# Patient Record
Sex: Female | Born: 1965 | Race: Black or African American | Hispanic: No | Marital: Single | Smoking: Never smoker
Health system: Southern US, Community
[De-identification: ages and names within clinical notes are randomized; demographics above are authoritative.]

---

## 1998-06-20 ENCOUNTER — Other Ambulatory Visit: Admission: RE | Admit: 1998-06-20 | Discharge: 1998-06-20 | Payer: Self-pay | Admitting: Obstetrics and Gynecology

## 2013-11-01 ENCOUNTER — Ambulatory Visit (INDEPENDENT_AMBULATORY_CARE_PROVIDER_SITE_OTHER): Payer: BC Managed Care – PPO | Admitting: Family Medicine

## 2013-11-01 ENCOUNTER — Ambulatory Visit (INDEPENDENT_AMBULATORY_CARE_PROVIDER_SITE_OTHER): Payer: BC Managed Care – PPO

## 2013-11-01 VITALS — BP 130/90 | HR 70 | Temp 97.7°F | Resp 16 | Ht 65.0 in | Wt 201.0 lb

## 2013-11-01 DIAGNOSIS — T148XXA Other injury of unspecified body region, initial encounter: Secondary | ICD-10-CM

## 2013-11-01 DIAGNOSIS — S43499A Other sprain of unspecified shoulder joint, initial encounter: Secondary | ICD-10-CM

## 2013-11-01 DIAGNOSIS — M546 Pain in thoracic spine: Secondary | ICD-10-CM

## 2013-11-01 DIAGNOSIS — S46819A Strain of other muscles, fascia and tendons at shoulder and upper arm level, unspecified arm, initial encounter: Secondary | ICD-10-CM

## 2013-11-01 DIAGNOSIS — S46811A Strain of other muscles, fascia and tendons at shoulder and upper arm level, right arm, initial encounter: Secondary | ICD-10-CM

## 2013-11-01 MED ORDER — MELOXICAM 7.5 MG PO TABS: 7.5000 mg | ORAL_TABLET | Freq: Two times a day (BID) | ORAL | Status: AC | PRN

## 2013-11-01 MED ORDER — CYCLOBENZAPRINE HCL 5 MG PO TABS: 5.0000 mg | ORAL_TABLET | Freq: Every evening | ORAL | Status: AC | PRN

## 2013-11-01 NOTE — Progress Notes (Signed)
      Chief Complaint:  Chief Complaint  Patient presents with  . Neck Pain    x 3 weeks    HPI: Natasha Calderon is a 48 y.o. female who is here for  3-4 week right neck and upper back pain, mostly upper back. She has no weakness, numbness tngling Works at [employer], some repeitive motion.NKI, she has had no prior injuries. 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  Has tried ibuprofen and tylenol without releif.   History reviewed. No pertinent past medical history. History reviewed. No pertinent past surgical history. History   Social History  . Marital Status: Single    Spouse Name: N/A    Number of Children: N/A  . Years of Education: N/A   Social History Main Topics  . Smoking status: Never Smoker   . Smokeless tobacco: None  . Alcohol Use: None  . Drug Use: None  . Sexual Activity: None   Other Topics Concern  . None   Social History Narrative  . None   Family History  Problem Relation Age of Onset  . Stroke Mother    No Known Allergies Prior to Admission medications   Not on File     ROS: The patient denies fevers, chills, night sweats, unintentional weight loss, chest pain, palpitations, wheezing, dyspnea on exertion, nausea, vomiting, abdominal pain, dysuria, hematuria, melena, numbness, weakness, or tingling.   All other systems have been reviewed and were otherwise negative with the exception of those mentioned in the HPI and as above.    PHYSICAL EXAM: Filed Vitals:   11/01/13 1134  BP: 130/90  Pulse: 70  Temp: 97.7 F (36.5 C)  Resp: 16   Filed Vitals:   11/01/13 1134  Height:  (1.651 m)  Weight: 201 lb (91.173 kg)   Body mass index is 33.45 kg/(m^2).  General: Alert, no acute distress HEENT:  Normocephalic, atraumatic, oropharynx patent. EOMI, PERRLA Cardiovascular:  Regular rate and rhythm, no rubs murmurs or gallops.  No Carotid bruits, radial pulse intact. No pedal  edema.  Respiratory: Clear to auscultation bilaterally.  No wheezes, rales, or rhonchi.  No cyanosis, no use of accessory musculature GI: No organomegaly, abdomen is soft and non-tender, positive bowel sounds.  No masses. Skin: No rashes. Neurologic: Facial musculature symmetric. Psychiatric: Patient is appropriate throughout our interaction. Lymphatic: No cervical lymphadenopathy Musculoskeletal: Gait intact. Full ROm , neg deformity, neg Spurling Right trapezius tenderness + paramsk tenderness  Full ROM 5/5 strength, 2/2 DTRs Shoulder exam normal    LABS: No results found for this or any previous visit.   EKG/XRAY:   Primary read interpreted by Dr. Conley Rolls at [facility]. Neg for fx or dislocation Minimal djd   ASSESSMENT/PLAN: Encounter Diagnoses  Name Primary?  . Right-sided thoracic back pain Yes  . Sprain and strain   . Trapezius muscle strain, right, initial encounter    Rx flexeril and mobic prn ROM exercises Fu prn  Gross sideeffects, risk and benefits, and alternatives of medications d/w patient. Patient is aware that all medications have potential sideeffects and we are unable to predict every sideeffect or drug-drug interaction that may occur.  Hamilton Capri PHUONG, DO 11/01/2013 12:31 PM

## 2013-11-04 ENCOUNTER — Telehealth: Payer: Self-pay

## 2013-11-04 NOTE — Telephone Encounter (Signed)
Pt states she is still in pain from recent visit with Dr Conley Rolls. "hurting worse than i was" and is afraid to take nsaids with current pain rx.  Is unable to afford a copay until next week when she gets paid again and asks if there is anything else we can try for her. States she is taking rx as directed.   Please call pt Pharmacy: [facility] [address]

## 2013-11-06 ENCOUNTER — Other Ambulatory Visit: Payer: Self-pay | Admitting: Family Medicine

## 2013-11-06 DIAGNOSIS — T148XXA Other injury of unspecified body region, initial encounter: Secondary | ICD-10-CM

## 2013-11-06 MED ORDER — HYDROCODONE-ACETAMINOPHEN 5-325 MG PO TABS
1.0000 | ORAL_TABLET | Freq: Four times a day (QID) | ORAL | Status: AC | PRN
Start: 2013-11-06 — End: ?

## 2013-11-07 NOTE — Telephone Encounter (Signed)
Asked Magda Paganiniudrey to call patient to pick up Norco rx up front, avoid tylenol with meds.

## 2014-10-18 ENCOUNTER — Other Ambulatory Visit: Payer: Self-pay | Admitting: *Deleted

## 2014-10-18 DIAGNOSIS — E041 Nontoxic single thyroid nodule: Secondary | ICD-10-CM

## 2014-10-23 ENCOUNTER — Ambulatory Visit
Admission: RE | Admit: 2014-10-23 | Discharge: 2014-10-23 | Disposition: A | Discharge status: Home / Self Care | Payer: BLUE CROSS/BLUE SHIELD | Source: Ambulatory Visit | Attending: *Deleted | Admitting: *Deleted

## 2014-10-23 DIAGNOSIS — E041 Nontoxic single thyroid nodule: Secondary | ICD-10-CM

## 2015-12-12 ENCOUNTER — Other Ambulatory Visit: Payer: Self-pay | Admitting: Family Medicine

## 2015-12-12 DIAGNOSIS — E049 Nontoxic goiter, unspecified: Secondary | ICD-10-CM

## 2015-12-13 ENCOUNTER — Ambulatory Visit
Admission: RE | Admit: 2015-12-13 | Discharge: 2015-12-13 | Disposition: A | Discharge status: Home / Self Care | Payer: BLUE CROSS/BLUE SHIELD | Source: Ambulatory Visit | Attending: Family Medicine | Admitting: Family Medicine

## 2015-12-13 DIAGNOSIS — E049 Nontoxic goiter, unspecified: Secondary | ICD-10-CM

## 2015-12-24 ENCOUNTER — Other Ambulatory Visit: Payer: Self-pay | Admitting: Family Medicine

## 2015-12-24 DIAGNOSIS — E041 Nontoxic single thyroid nodule: Secondary | ICD-10-CM

## 2016-01-21 ENCOUNTER — Inpatient Hospital Stay: Admission: RE | Admit: 2016-01-21 | Payer: BLUE CROSS/BLUE SHIELD | Source: Ambulatory Visit

## 2016-01-23 ENCOUNTER — Ambulatory Visit
Admission: RE | Admit: 2016-01-23 | Discharge: 2016-01-23 | Disposition: A | Discharge status: Home / Self Care | Payer: BLUE CROSS/BLUE SHIELD | Source: Ambulatory Visit | Attending: Family Medicine | Admitting: Family Medicine

## 2016-01-23 ENCOUNTER — Other Ambulatory Visit (HOSPITAL_COMMUNITY)
Admission: RE | Admit: 2016-01-23 | Discharge: 2016-01-23 | Disposition: A | Discharge status: Home / Self Care | Payer: BLUE CROSS/BLUE SHIELD | Source: Ambulatory Visit | Attending: General Surgery | Admitting: General Surgery

## 2016-01-23 DIAGNOSIS — E041 Nontoxic single thyroid nodule: Secondary | ICD-10-CM

## 2017-02-17 ENCOUNTER — Other Ambulatory Visit: Payer: Self-pay | Admitting: Family Medicine

## 2017-02-17 ENCOUNTER — Ambulatory Visit
Admission: RE | Admit: 2017-02-17 | Discharge: 2017-02-17 | Disposition: A | Discharge status: Home / Self Care | Payer: BLUE CROSS/BLUE SHIELD | Source: Ambulatory Visit | Attending: Family Medicine | Admitting: Family Medicine

## 2017-02-17 DIAGNOSIS — M25571 Pain in right ankle and joints of right foot: Secondary | ICD-10-CM

## 2017-03-29 IMAGING — US US THYROID BIOPSY
1 series · 13 of 14 positions shown · non-contrast
Comparison: Ultrasound on December 13, 2015

MEDICATIONS:
1% lidocaine

COMPLICATIONS:
None immediate.

INDICATION: Indeterminate thyroid nodule within the right thyroid lobe

EXAM:
ULTRASOUND GUIDED FINE NEEDLE ASPIRATION OF INDETERMINATE THYROID
NODULE
TECHNIQUE: Informed written consent was obtained from the patient after a
discussion of the risks, benefits and alternatives to treatment.
Questions regarding the procedure were encouraged and answered. A
timeout was performed prior to the initiation of the procedure.

[Series 1: us thyroid biopsy · 0.07mm/px · 14 acquisitions, 13 frames shown]
[im 1/14]
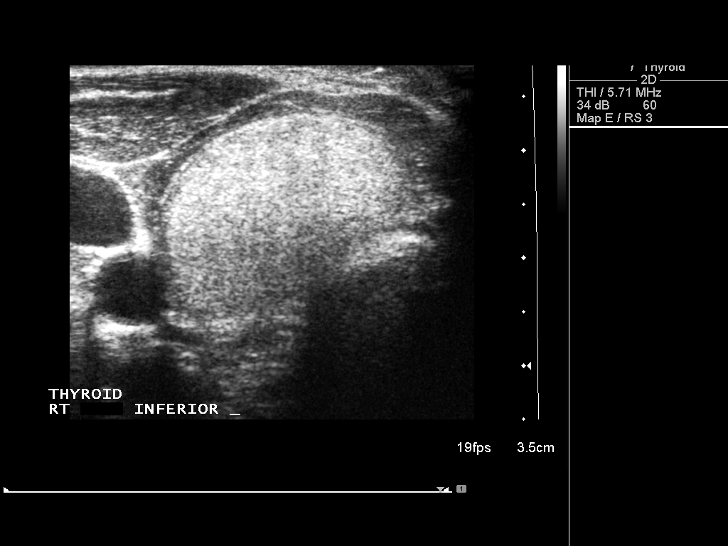
[im 2/14]
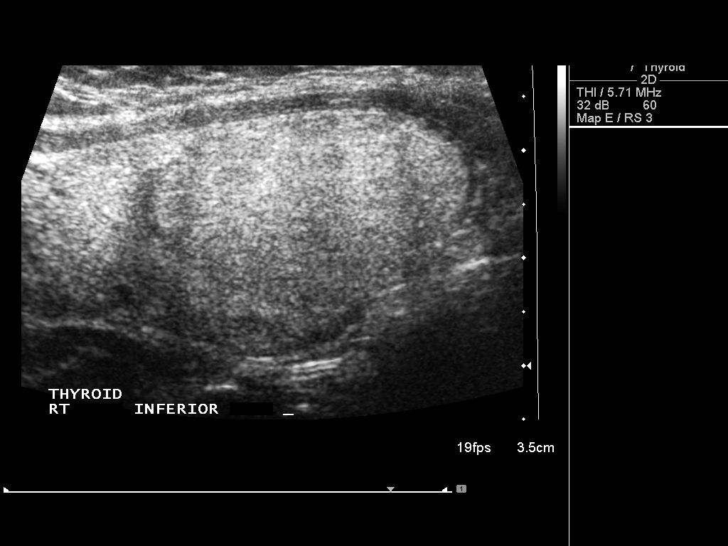
[im 3/14]
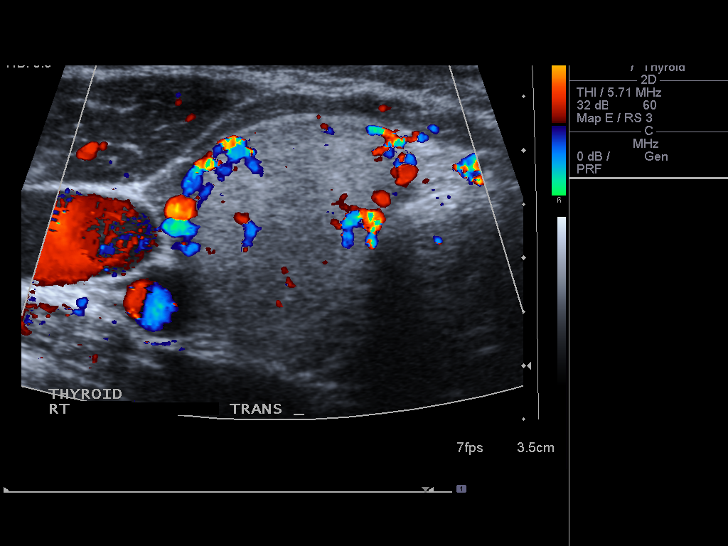
[im 4/14]
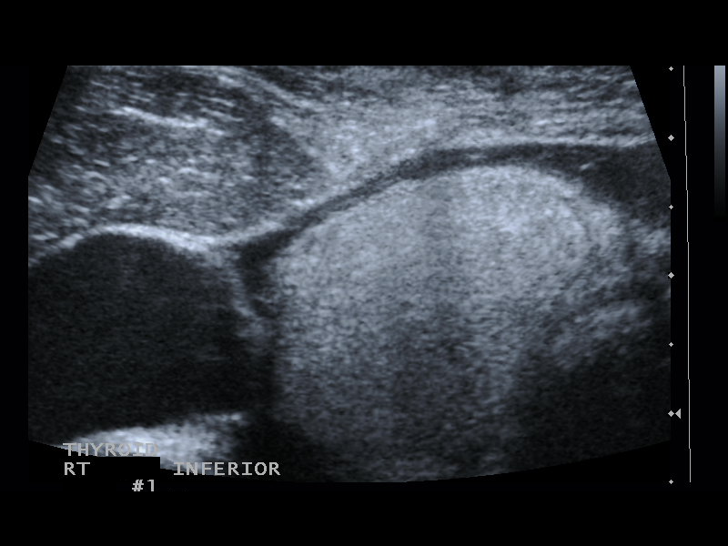
[im 5/14]
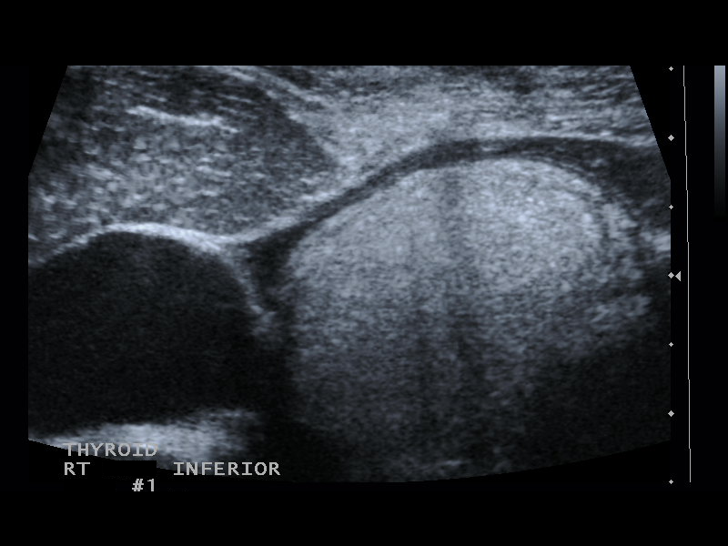
[im 6/14]
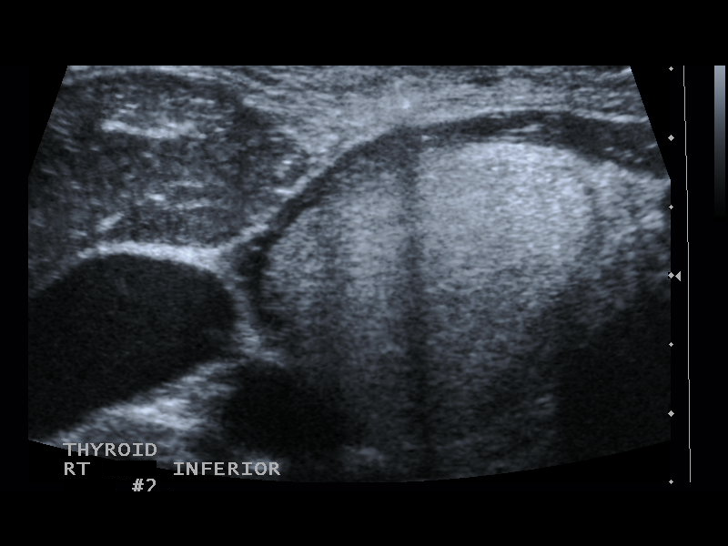
[im 8/14]
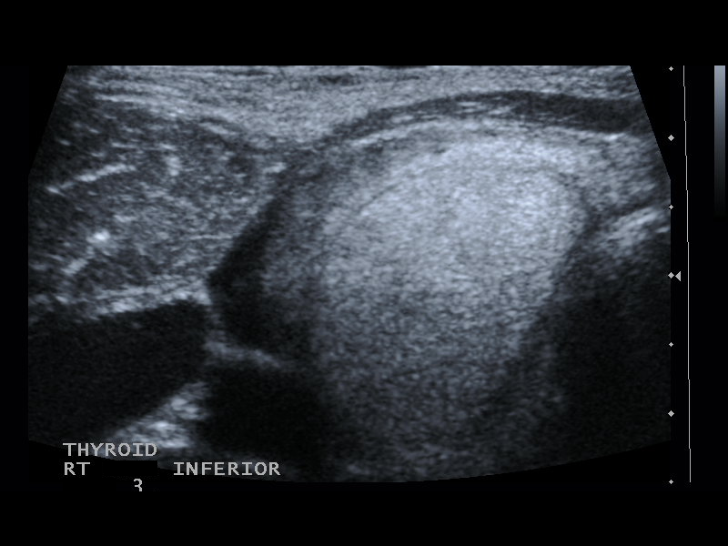
[im 9/14]
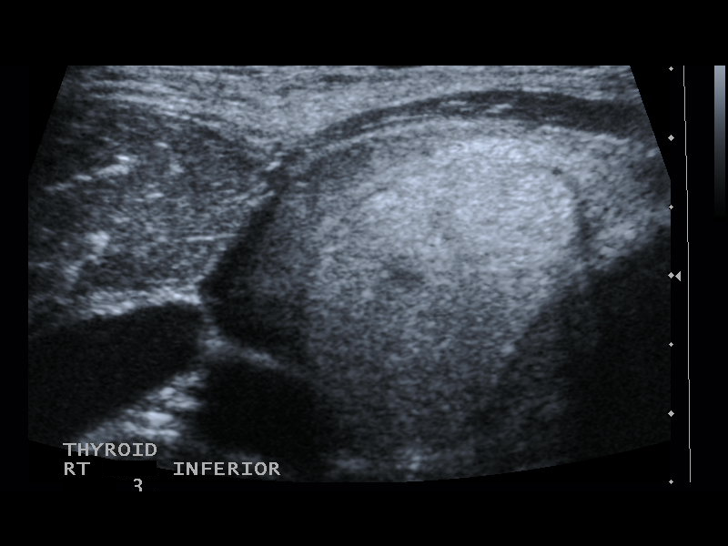
[im 10/14]
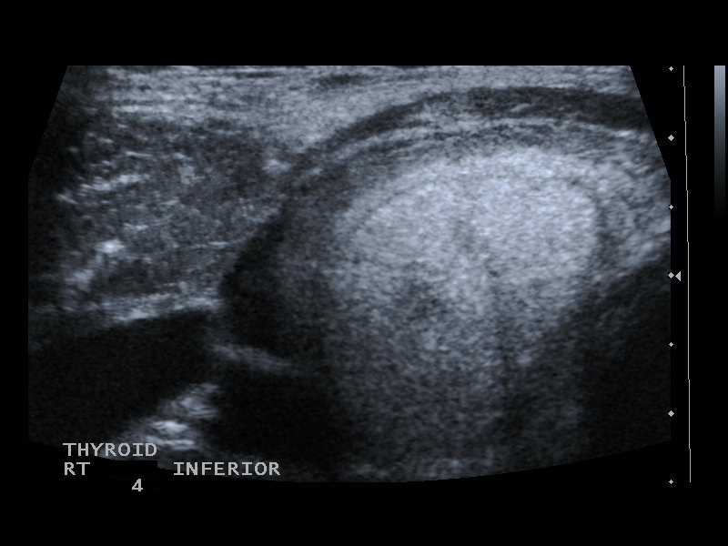
[im 11/14]
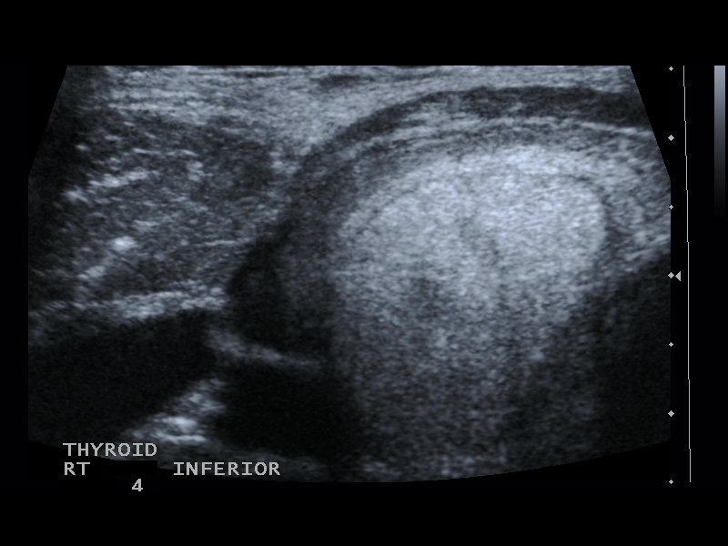
[im 12/14]
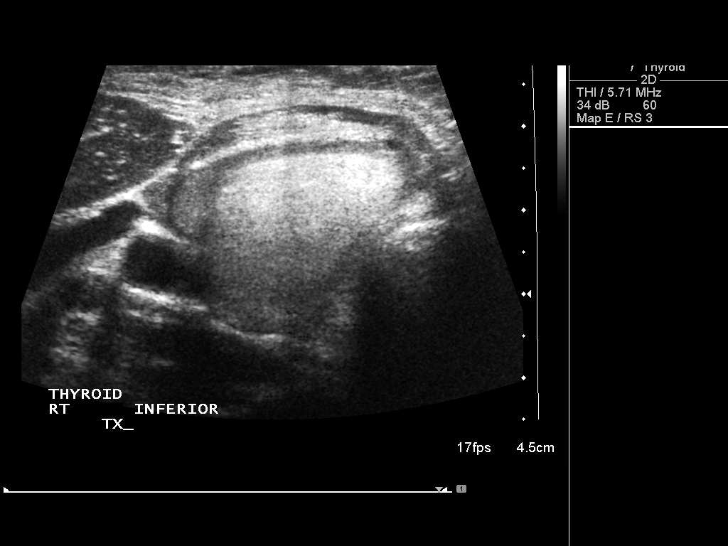
[im 13/14]
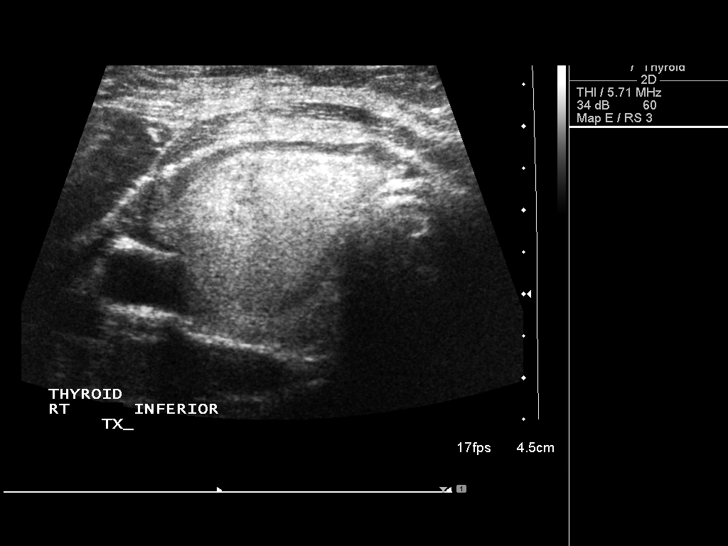
[im 14/14]
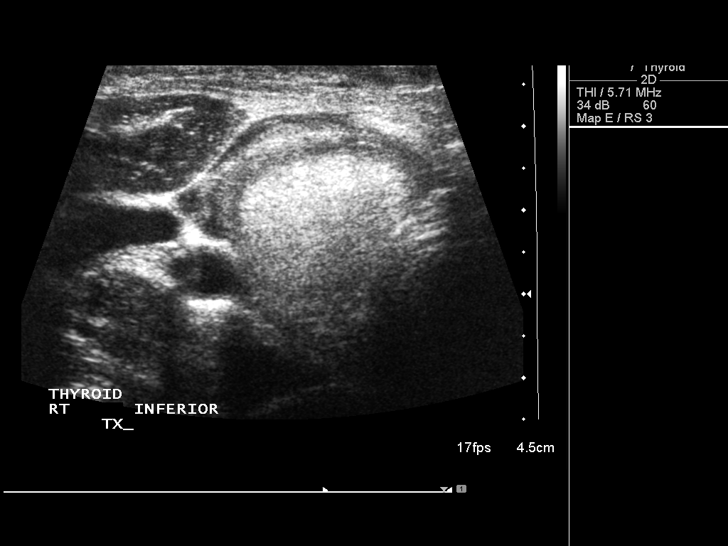

[13 of 14 positions shown; findings below may reference images not displayed]

Pre-procedural ultrasound scanning demonstrated unchanged size and
appearance of the indeterminate nodule within the right thyroid
lobe.

The procedure was planned. The neck was prepped in the usual sterile
fashion, and a sterile drape was applied covering the operative
field. A timeout was performed prior to the initiation of the
procedure. Local anesthesia was provided with 1% lidocaine.

Under direct ultrasound guidance, 4 FNA biopsies were performed of
the right inferior lobe nodule with a 25 gauge needle. Multiple
ultrasound images were saved for procedural documentation purposes.
The samples were prepared and submitted to pathology.

Limited post procedural scanning was negative for hematoma or
additional complication. Dressings were placed. The patient
tolerated the above procedures procedure well without immediate
postprocedural complication.
FINDINGS: FINDINGS
Nodule reference number based on prior diagnostic ultrasound: 1

Maximum size: 2.9 cm

Location: Right  ;  Inferior

ACR TI-RADS total points: 3

ACR TI-RADS risk category:  TR3 (3 points)

Prior biopsy:  No

Reason for biopsy: meets ACR TI-RADS criteria

Ultrasound imaging confirms appropriate placement of the needles
within the thyroid nodule.
IMPRESSION: Technically successful ultrasound guided fine needle aspiration of
nodule 1 within the right inferior lobe of the thyroid.

## 2018-04-24 IMAGING — CR DG ANKLE COMPLETE 3+V*R*
3 series · 3 of 3 positions shown · non-contrast
Comparison: None in PACs

CLINICAL DATA: Stepped down wrong from a truck one week ago.
Persistent anterior and lateral pain.

EXAM:
RIGHT ANKLE - COMPLETE 3+ VIEW

[x ankle ap right]
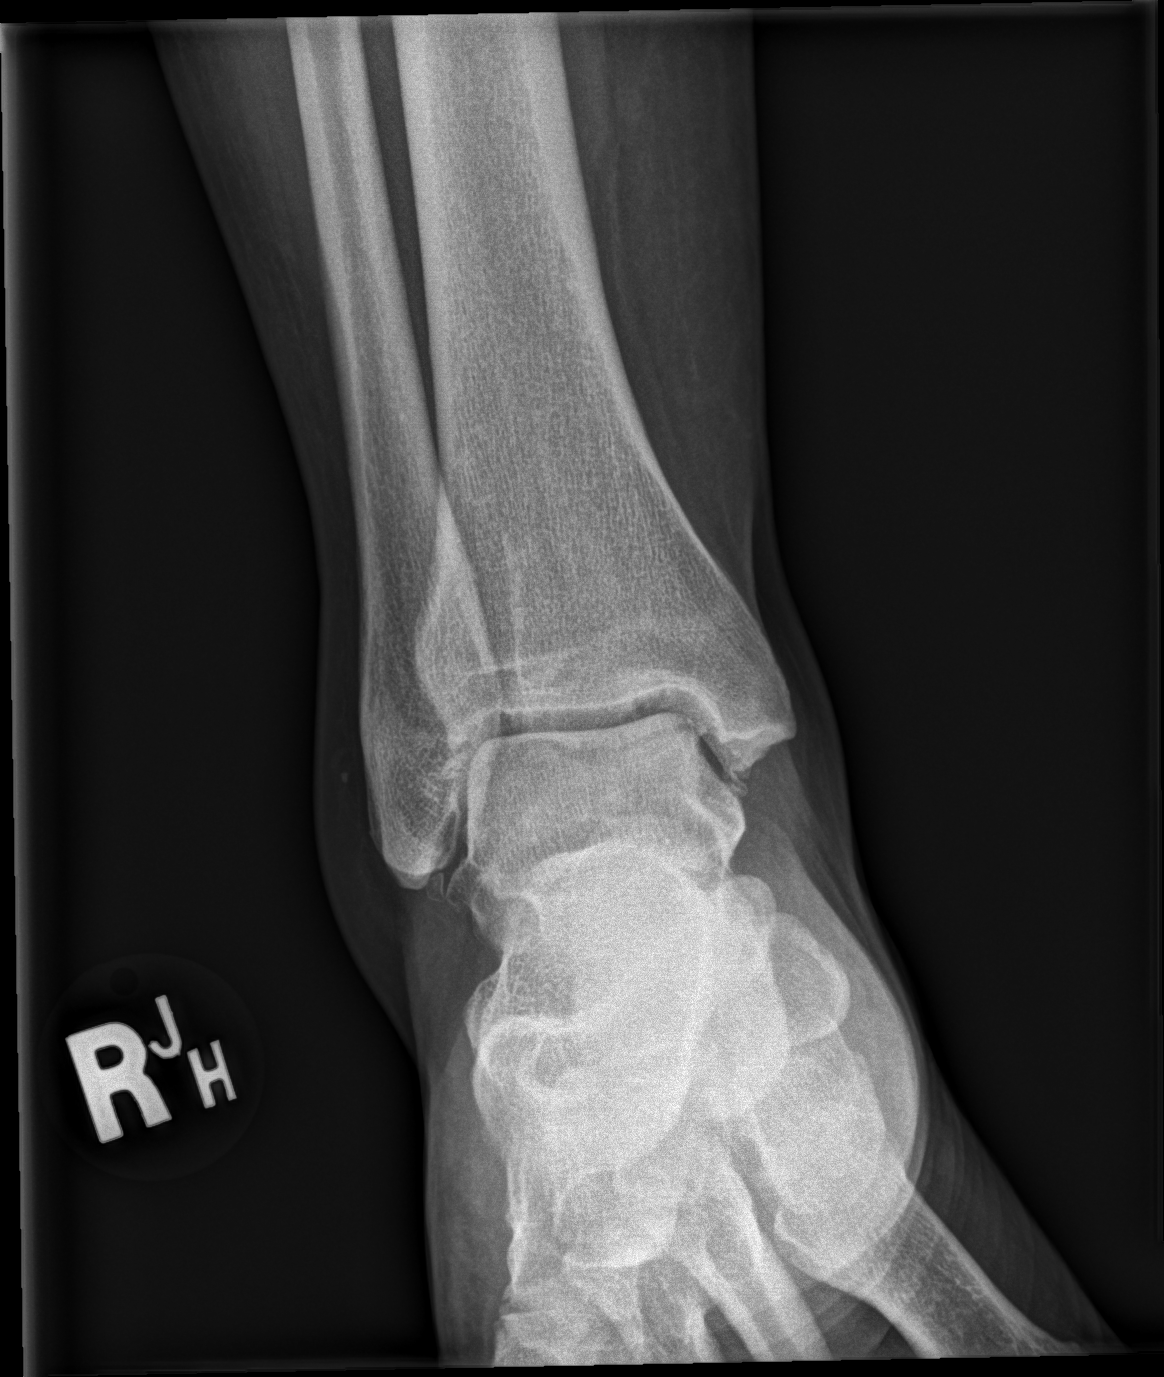

[x ankle obl right]
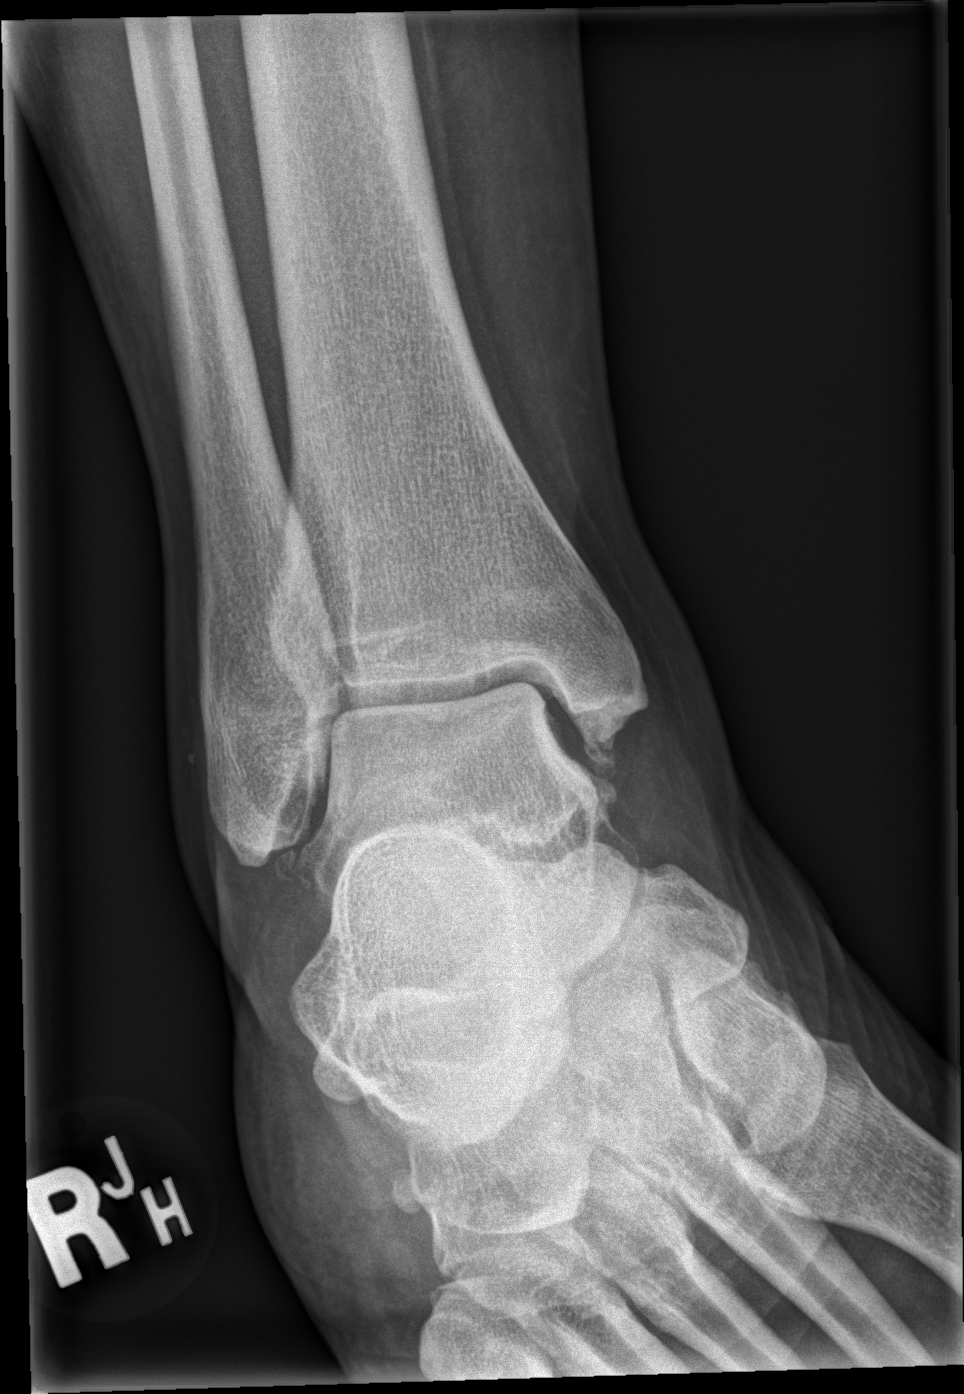

[x ankle lat right]
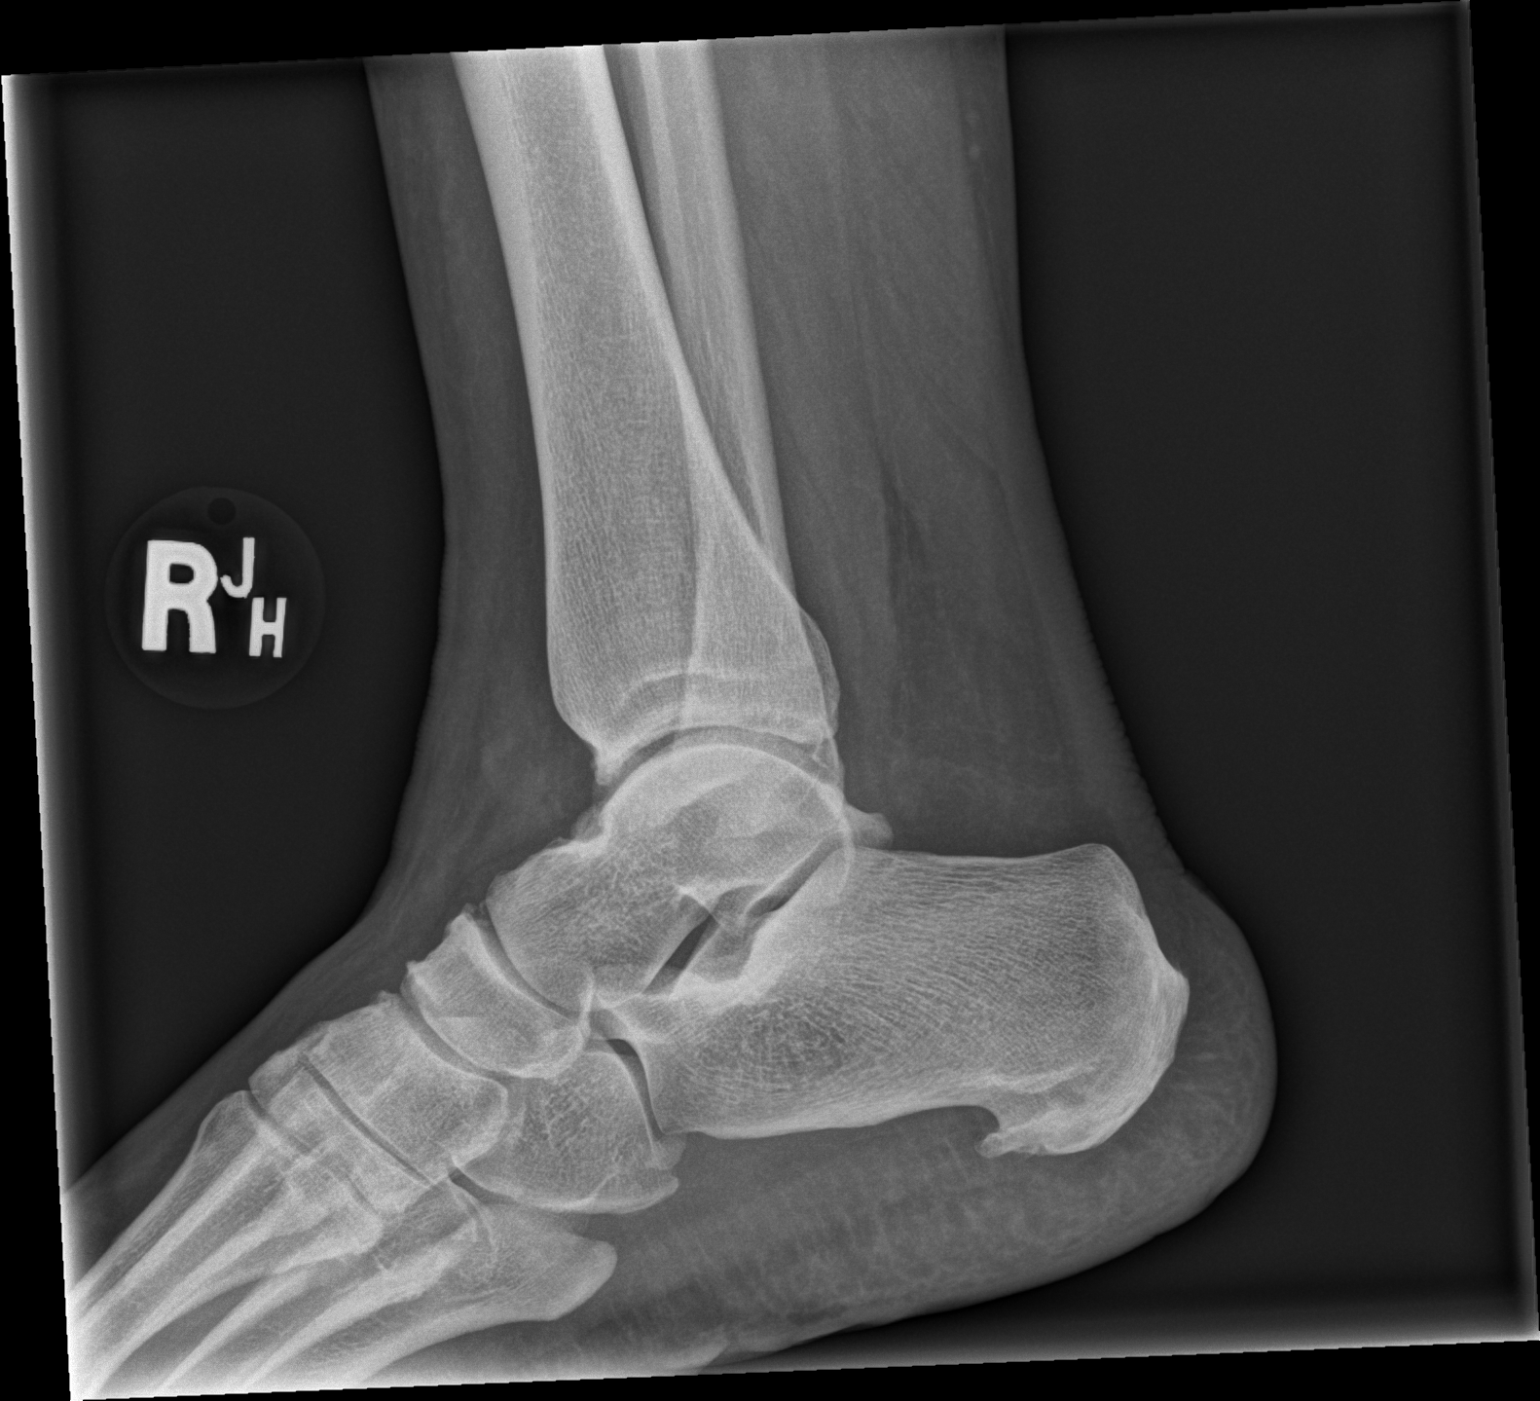

[3 of 3 positions shown; findings below may reference images not displayed]

FINDINGS: There is a tiny bony density adjacent to the tip of the medial
malleolus which may reflect an acute fracture through the extreme
inferior tip of the malleolus. There is a well corticated bony
density adjacent to the tip of the lateral malleolus and the lateral
aspect of the talus that likely reflects a chronic process either
post traumatic or degenerative. The talar dome is intact. The joint
mortise is preserved. The talus and calcaneus exhibit no acute
abnormalities. There is a plantar calcaneal spur. There is mild
diffuse soft tissue swelling. The metatarsal bases are intact where
visualized.
IMPRESSION: Possible acute fracture through the inferior tip of the medial
malleolus. Probable old avulsion fracture fragment versus
degenerative calcification inferior to the tip of the lateral
malleolus.
